# Patient Record
Sex: Male | Born: 1940 | Race: White | Hispanic: No | Marital: Married | Smoking: Former smoker
Health system: Southern US, Community
[De-identification: ages and names within clinical notes are randomized; demographics above are authoritative.]

---

## 1960-06-15 HISTORY — PX: APPENDECTOMY: SHX54

## 2010-04-10 ENCOUNTER — Encounter: Admission: RE | Admit: 2010-04-10 | Discharge: 2010-04-10 | Payer: Self-pay | Admitting: Family Medicine

## 2010-08-12 ENCOUNTER — Ambulatory Visit: Payer: Medicare Other | Attending: Orthopedic Surgery | Admitting: Physical Therapy

## 2010-08-12 DIAGNOSIS — M542 Cervicalgia: Secondary | ICD-10-CM | POA: Insufficient documentation

## 2010-08-12 DIAGNOSIS — IMO0001 Reserved for inherently not codable concepts without codable children: Secondary | ICD-10-CM | POA: Insufficient documentation

## 2010-08-12 DIAGNOSIS — M2569 Stiffness of other specified joint, not elsewhere classified: Secondary | ICD-10-CM | POA: Insufficient documentation

## 2010-08-12 DIAGNOSIS — R293 Abnormal posture: Secondary | ICD-10-CM | POA: Insufficient documentation

## 2010-08-12 DIAGNOSIS — R5381 Other malaise: Secondary | ICD-10-CM | POA: Insufficient documentation

## 2010-08-14 ENCOUNTER — Ambulatory Visit: Payer: Medicare Other | Attending: Orthopedic Surgery | Admitting: Physical Therapy

## 2010-08-14 DIAGNOSIS — R5381 Other malaise: Secondary | ICD-10-CM | POA: Insufficient documentation

## 2010-08-14 DIAGNOSIS — IMO0001 Reserved for inherently not codable concepts without codable children: Secondary | ICD-10-CM | POA: Insufficient documentation

## 2010-08-14 DIAGNOSIS — M542 Cervicalgia: Secondary | ICD-10-CM | POA: Insufficient documentation

## 2010-08-14 DIAGNOSIS — R293 Abnormal posture: Secondary | ICD-10-CM | POA: Insufficient documentation

## 2010-08-14 DIAGNOSIS — M2569 Stiffness of other specified joint, not elsewhere classified: Secondary | ICD-10-CM | POA: Insufficient documentation

## 2010-08-19 ENCOUNTER — Ambulatory Visit: Payer: Medicare Other | Admitting: *Deleted

## 2010-08-21 ENCOUNTER — Ambulatory Visit: Payer: Medicare Other | Admitting: *Deleted

## 2010-08-30 ENCOUNTER — Encounter: Payer: Self-pay | Admitting: *Deleted

## 2010-08-30 DIAGNOSIS — I10 Essential (primary) hypertension: Secondary | ICD-10-CM | POA: Insufficient documentation

## 2010-08-30 DIAGNOSIS — C679 Malignant neoplasm of bladder, unspecified: Secondary | ICD-10-CM | POA: Insufficient documentation

## 2010-08-30 DIAGNOSIS — K219 Gastro-esophageal reflux disease without esophagitis: Secondary | ICD-10-CM | POA: Insufficient documentation

## 2010-08-30 DIAGNOSIS — E785 Hyperlipidemia, unspecified: Secondary | ICD-10-CM | POA: Insufficient documentation

## 2010-08-30 DIAGNOSIS — F32A Depression, unspecified: Secondary | ICD-10-CM | POA: Insufficient documentation

## 2010-08-30 DIAGNOSIS — F329 Major depressive disorder, single episode, unspecified: Secondary | ICD-10-CM

## 2010-10-29 ENCOUNTER — Other Ambulatory Visit: Payer: Self-pay | Admitting: Family Medicine

## 2010-10-29 DIAGNOSIS — R9389 Abnormal findings on diagnostic imaging of other specified body structures: Secondary | ICD-10-CM

## 2010-10-30 ENCOUNTER — Other Ambulatory Visit: Payer: Medicare Other

## 2010-11-07 ENCOUNTER — Other Ambulatory Visit: Payer: Medicare Other

## 2010-11-13 ENCOUNTER — Other Ambulatory Visit: Payer: Medicare Other

## 2010-11-14 ENCOUNTER — Ambulatory Visit
Admission: RE | Admit: 2010-11-14 | Discharge: 2010-11-14 | Disposition: A | Discharge status: Home / Self Care | Payer: Medicare Other | Source: Ambulatory Visit | Attending: Family Medicine | Admitting: Family Medicine

## 2010-11-14 DIAGNOSIS — R9389 Abnormal findings on diagnostic imaging of other specified body structures: Secondary | ICD-10-CM

## 2012-12-31 IMAGING — CT CT CHEST W/O CM
2 of 3 series · 14 of 31 positions shown, 16 images · non-contrast
Comparison: 04/10/2010

CLINICAL DATA: Follow up of abnormal chest CT.  Cough.  Shortness
of breath.  Ex-smoker.  History of bladder cancer in [id number].

CT CHEST WITHOUT CONTRAST
TECHNIQUE: Multidetector CT imaging of the chest was performed
following the standard protocol without IV contrast.

[Series 3: routine chest · axial · 0.75mm/px · z∈[-202,+3]mm · 6 of 59 slices shown, 8 images]
[im 9/59  mediastinal]
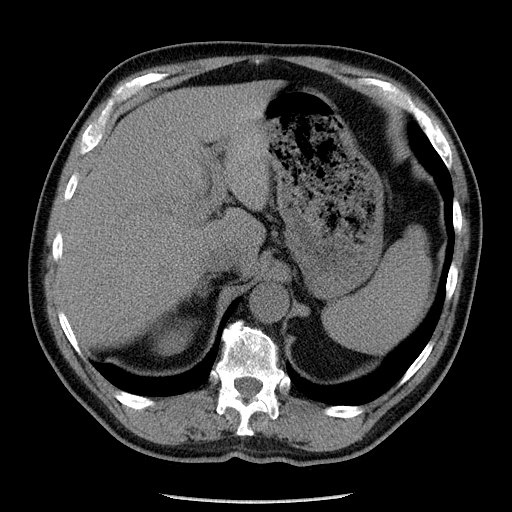
[im 9/59  lung]
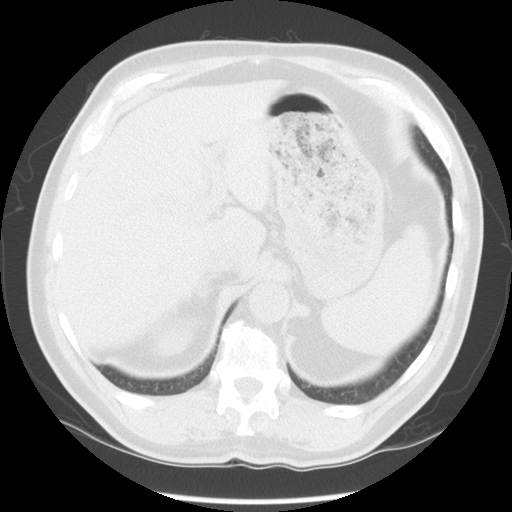
[im 17/59  lung]
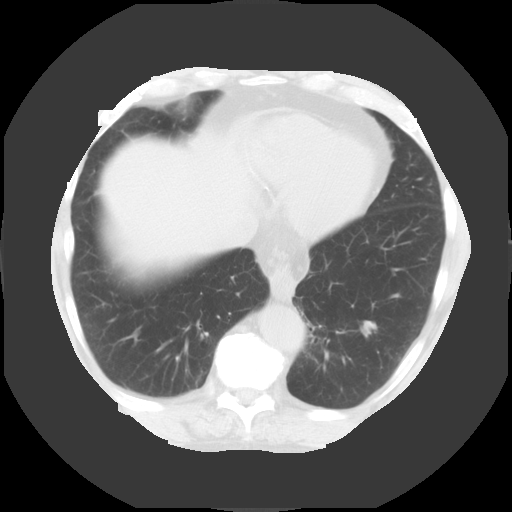
[im 25/59  lung]
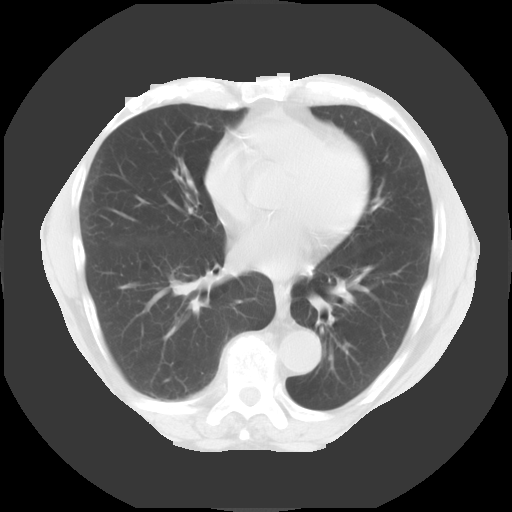
[im 34/59  lung]
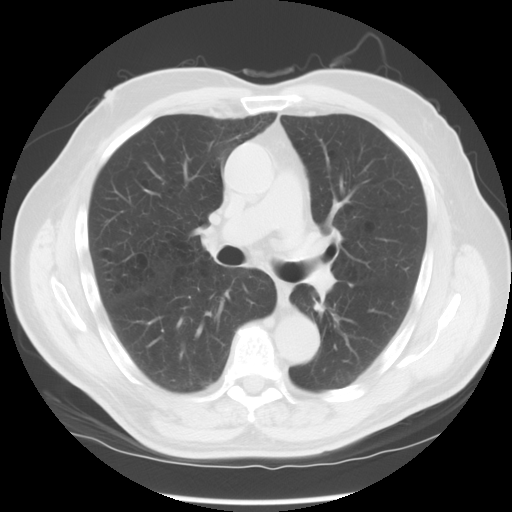
[im 42/59  mediastinal]
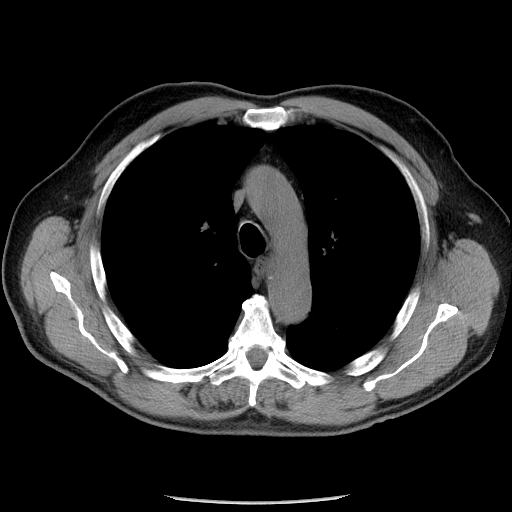
[im 42/59  lung]
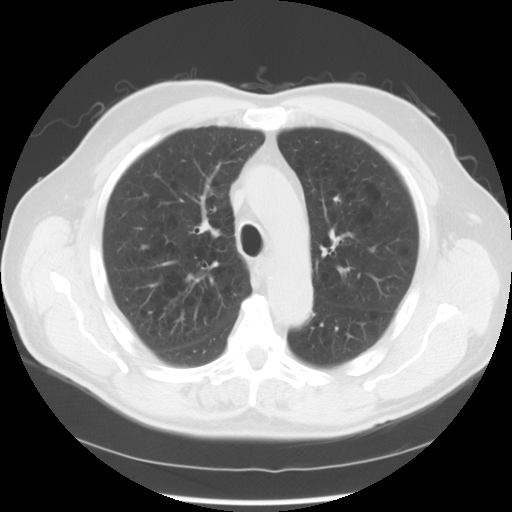
[im 50/59  lung]
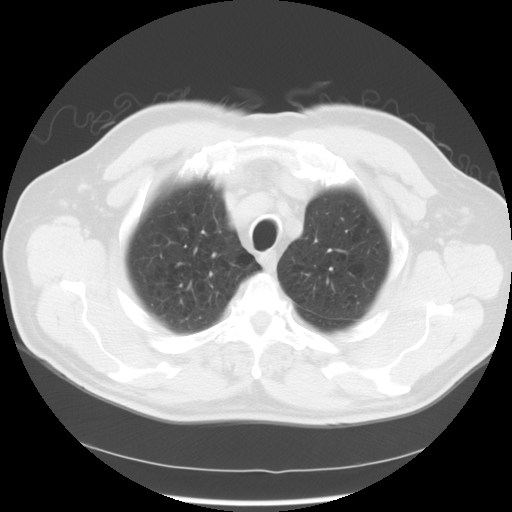

[Series 602: sagittal body · sagittal · 0.75mm/px · 8 of 155 slices shown]
[im 17/155  mediastinal]
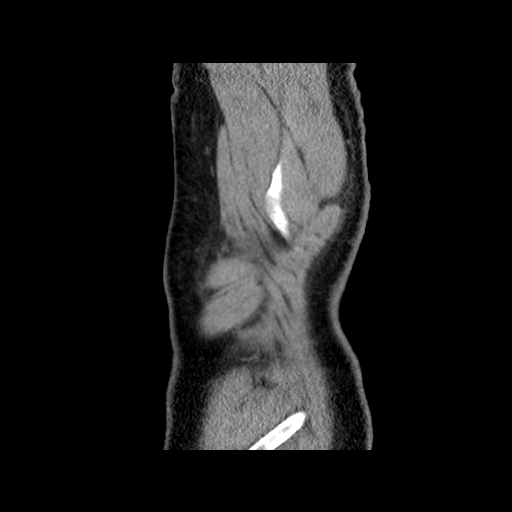
[im 33/155  mediastinal]
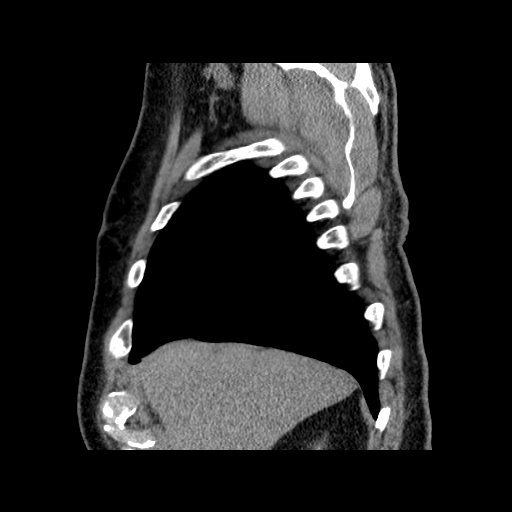
[im 49/155  mediastinal]
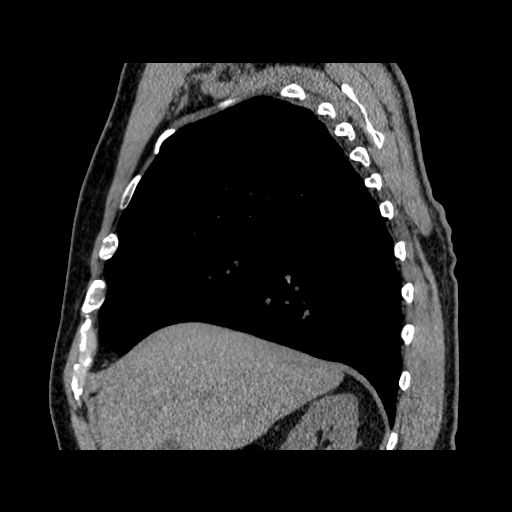
[im 73/155  mediastinal]
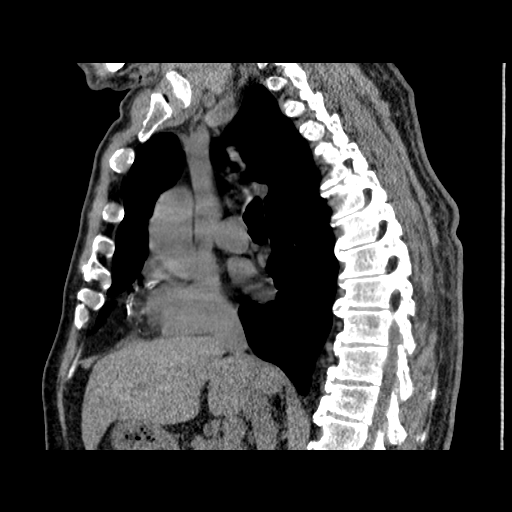
[im 82/155  mediastinal]
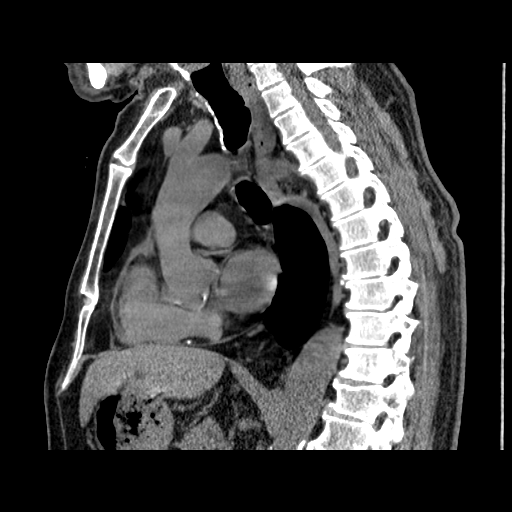
[im 106/155  mediastinal]
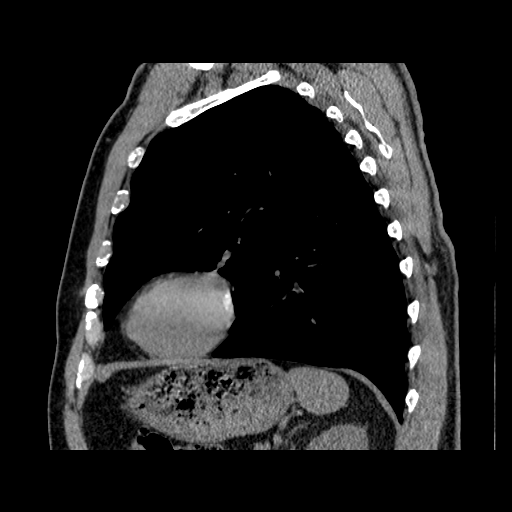
[im 122/155  mediastinal]
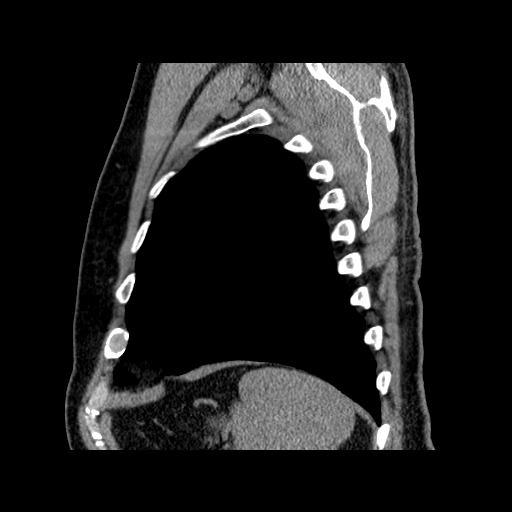
[im 138/155  mediastinal]
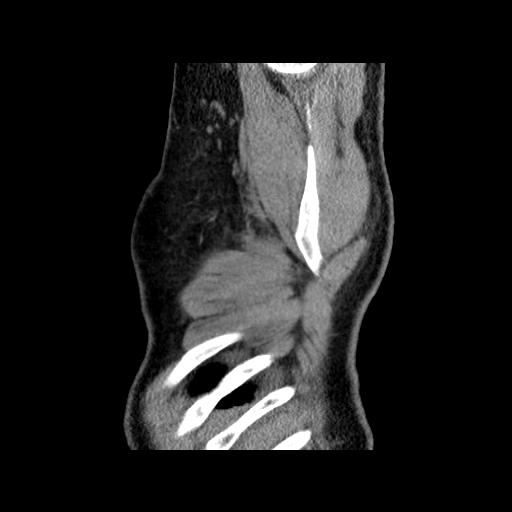

[14 of 31 positions shown; findings below may reference images not displayed]

FINDINGS: Lung windows demonstrate moderate centrilobular
emphysema.  The questioned scarring in the peripheral right middle
lobe is nearly completely resolved.

A 4 mm left lower lobe lung nodule on image 41 is unchanged.  There
may also be an adjacent 3 mm nodule just anteriorly on image 42.
This is also similar.

Soft tissue windows demonstrate tortuous descending thoracic aorta.
Normal heart size without pericardial or pleural effusion.
Multivessel coronary artery atherosclerosis.  No mediastinal or
definite hilar adenopathy, given limitations of unenhanced CT.
Small hiatal hernia.

Limited abdominal imaging demonstrates probable cyst in the left
lobe of the liver, unchanged.  Approximately 8 mm. No acute osseous
abnormality.
IMPRESSION: 1.  Interval near complete resolution of probable atelectasis in
the peripheral right middle lobe.
2.  Centrilobular emphysema with stable left lower lobe lung
nodules measuring up to 4 mm.  Recommend follow-up at approximately
March 2011. This recommendation follows the consensus statement:
Guidelines for Management of Small Pulmonary Nodules Detected on CT
Scans:  A Statement from the [HOSPITAL] as published in
[URL]
3. Multivessel coronary artery atherosclerosis.

## 2013-09-04 ENCOUNTER — Ambulatory Visit (INDEPENDENT_AMBULATORY_CARE_PROVIDER_SITE_OTHER): Payer: Medicare Other | Admitting: Family Medicine

## 2013-09-04 ENCOUNTER — Ambulatory Visit: Payer: Medicare Other

## 2013-09-04 ENCOUNTER — Encounter: Payer: Self-pay | Admitting: Family Medicine

## 2013-09-04 ENCOUNTER — Encounter (INDEPENDENT_AMBULATORY_CARE_PROVIDER_SITE_OTHER): Payer: Self-pay

## 2013-09-04 VITALS — BP 143/77 | HR 68 | Temp 97.0°F | Ht 70.0 in | Wt 224.0 lb

## 2013-09-04 DIAGNOSIS — M25512 Pain in left shoulder: Secondary | ICD-10-CM

## 2013-09-04 DIAGNOSIS — M25519 Pain in unspecified shoulder: Secondary | ICD-10-CM

## 2013-09-04 NOTE — Patient Instructions (Signed)

## 2013-09-04 NOTE — Progress Notes (Signed)
   Subjective:    Patient ID: Justin Shaffer, male    DOB: 05/11/1941, 73 y.o.   MRN: [id number]  HPI This 73 y.o. male presents for evaluation of left shoulder pain and discomfort.  He has been Having this problem for a long time and has not sought tx.  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  He states he gets associated left elbow and wrist and arm pain when he bowls.  He has hx of GERD HTN, and he has a couple moles he wants to have looked at.  He has not had hx of skin cancer.   Review of Systems C/o left shoulder pain No chest pain, SOB, HA, dizziness, vision change, N/V, diarrhea, constipation, dysuria, urinary urgency or frequency, myalgias, arthralgias or rash.     Objective:   Physical Exam Vital signs noted  Well developed well nourished male.  HEENT - Head atraumatic Normocephalic                Eyes - PERRLA, Conjuctiva - clear Sclera- Clear EOMI                Ears - EAC's Wnl TM's Wnl Gross Hearing WNL                Throat - oropharanx wnl Respiratory - Lungs CTA bilateral Cardiac - RRR S1 and S2 without murmur GI - Abdomen soft Nontender and bowel sounds active x 4 Extremities - No edema. Neuro - Grossly intact. MS - Tenderness to palpation left shoulder at acromium.  Decreased ROM left shoulder with          Internal and external rotation. Skin - black nevi on right upper back      Assessment & Plan:  Left shoulder pain - Xray left shoulder.  He wants to wait before getting referral or tx.  [place] - He would like to wait and get another opinion before being referred to dermatology.  Lysbeth Penner FNP

## 2013-09-05 ENCOUNTER — Ambulatory Visit (INDEPENDENT_AMBULATORY_CARE_PROVIDER_SITE_OTHER): Payer: Medicare Other

## 2013-09-05 ENCOUNTER — Other Ambulatory Visit: Payer: Medicare Other

## 2013-09-05 ENCOUNTER — Other Ambulatory Visit: Payer: Self-pay | Admitting: Family Medicine

## 2013-09-05 DIAGNOSIS — M25519 Pain in unspecified shoulder: Secondary | ICD-10-CM

## 2013-09-05 DIAGNOSIS — R52 Pain, unspecified: Secondary | ICD-10-CM

## 2013-10-05 ENCOUNTER — Encounter: Payer: Self-pay | Admitting: Family Medicine

## 2013-10-05 ENCOUNTER — Ambulatory Visit (INDEPENDENT_AMBULATORY_CARE_PROVIDER_SITE_OTHER): Payer: Medicare Other | Admitting: Family Medicine

## 2013-10-05 VITALS — BP 144/81 | HR 83 | Temp 97.6°F | Ht 70.0 in | Wt 223.6 lb

## 2013-10-05 DIAGNOSIS — M25512 Pain in left shoulder: Secondary | ICD-10-CM

## 2013-10-05 DIAGNOSIS — M25519 Pain in unspecified shoulder: Secondary | ICD-10-CM

## 2013-10-05 NOTE — Progress Notes (Signed)
   Subjective:    Patient ID: Justin Shaffer, male    DOB: May 31, 1941, 73 y.o.   MRN: [id number]  HPI  This 73 y.o. male presents for evaluation of follow up on left shoulder discomfort and left shoulder xray.  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  He has been bowling and has difficulty when he bowls.  Review of Systems C/o left shoulder discomfort   No chest pain, SOB, HA, dizziness, vision change, N/V, diarrhea, constipation, dysuria, urinary urgency or frequency, myalgias, arthralgias or rash.  Objective:   Physical Exam  Vital signs noted  Well developed well nourished male  MS - Decreased ROM in left shoulder with abduction, external, and internal rotation.       Assessment & Plan:  Left shoulder pain He will go to [place] and see his ortho doctor.  Lysbeth Penner FNP

## 2015-10-23 IMAGING — CR DG SHOULDER 2+V*L*
3 series · 3 of 3 positions shown · non-contrast
Comparison: None.

CLINICAL DATA: Left shoulder pain.

EXAM:
LEFT SHOULDER - 2+ VIEW

[view not recorded (1 of 3)]
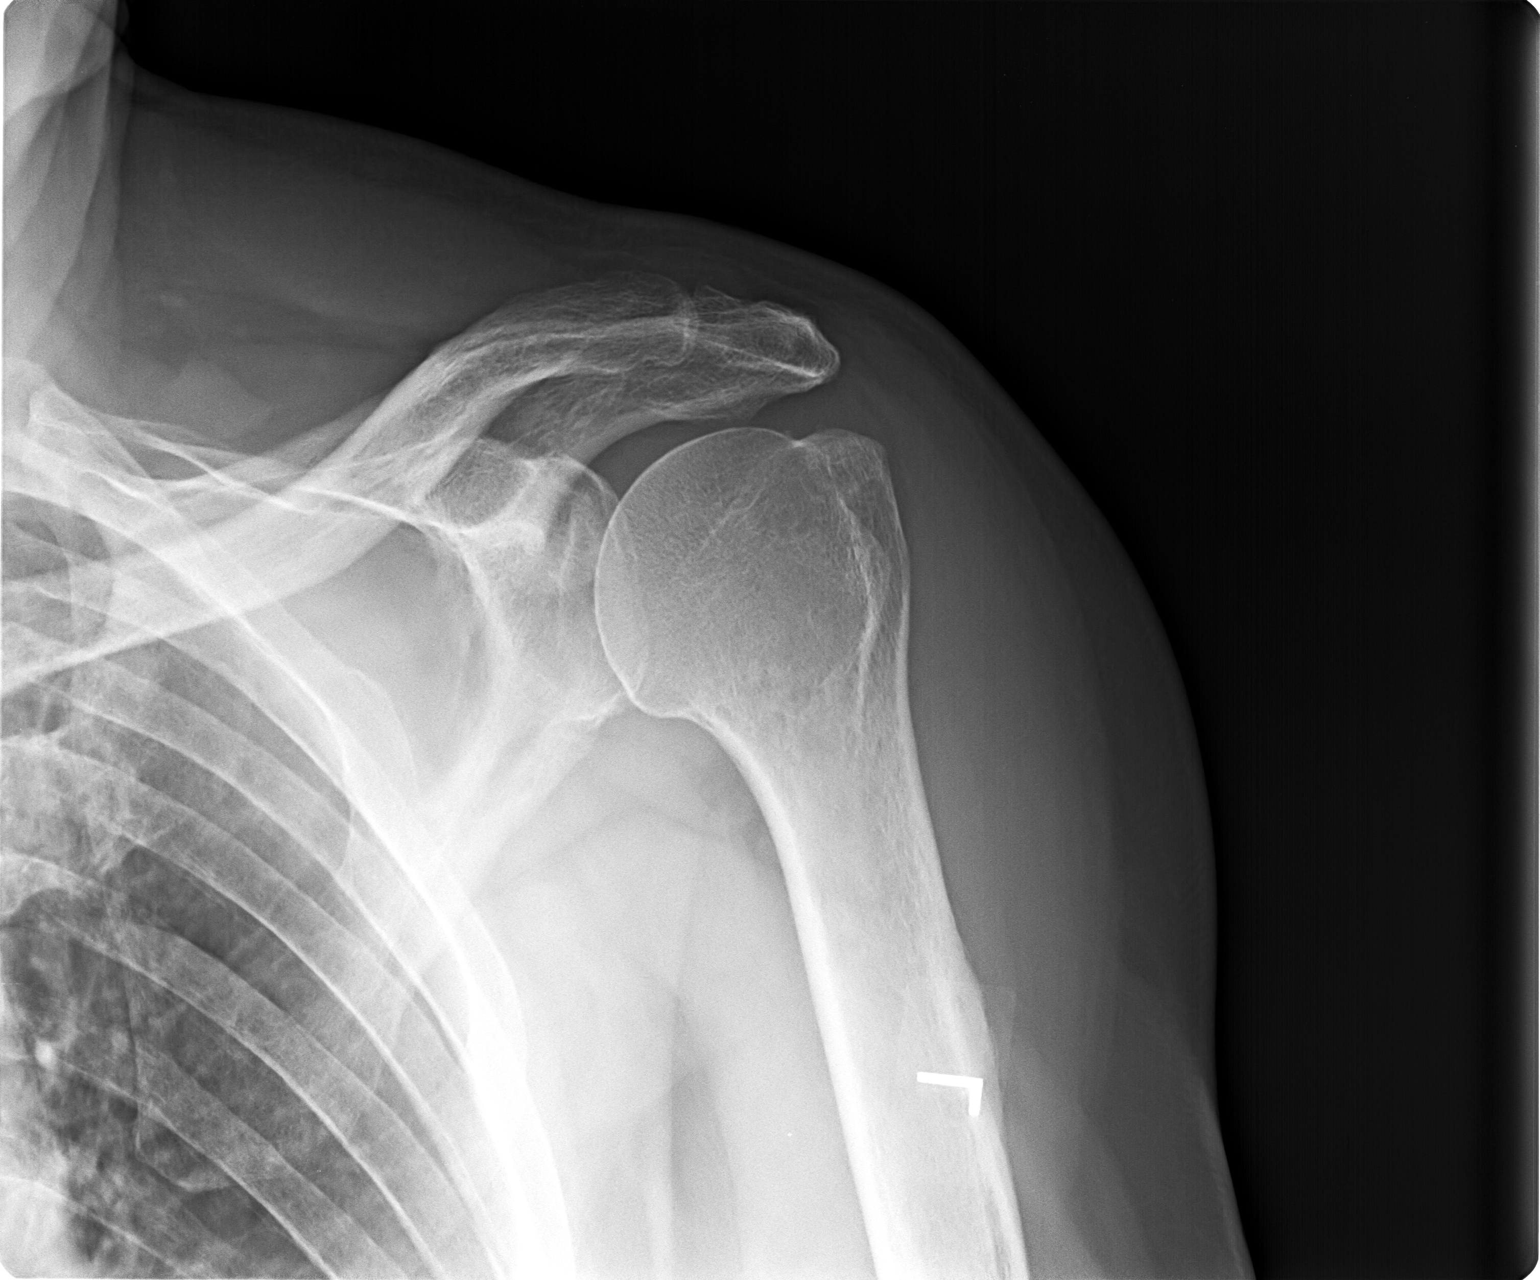

[view not recorded (2 of 3)]
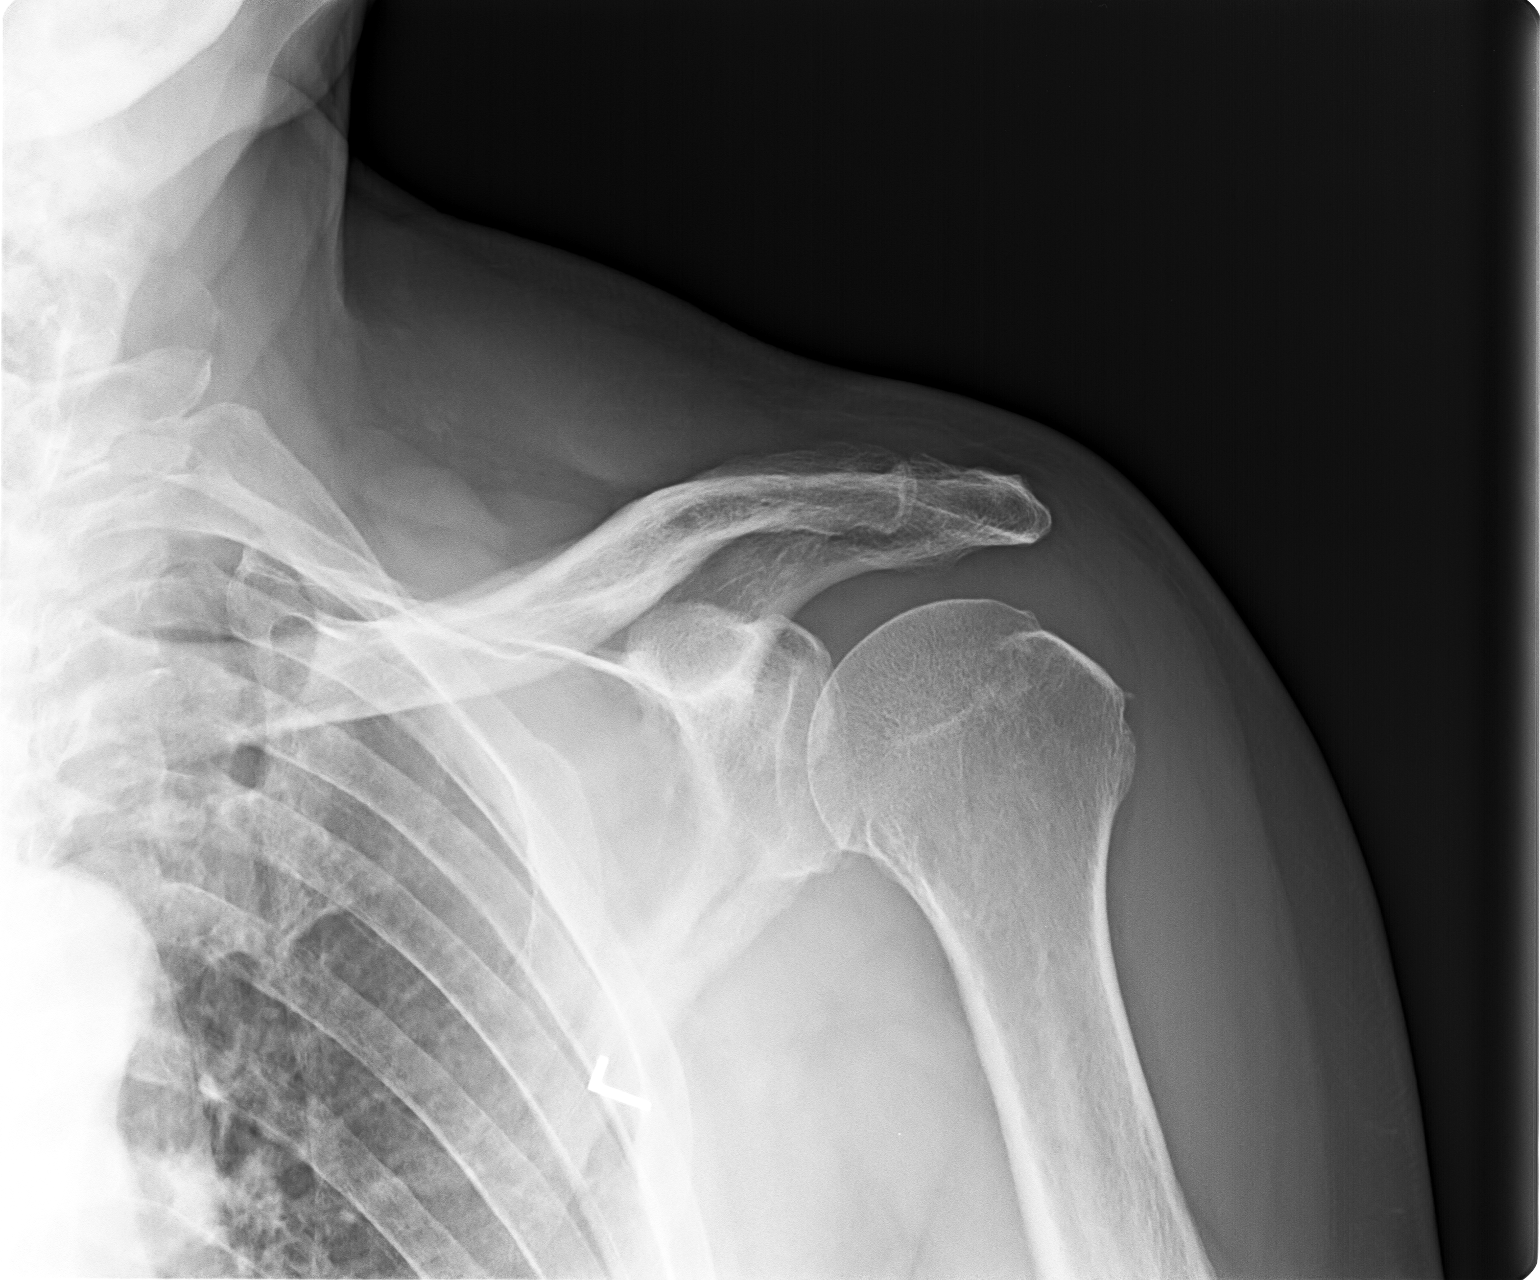

[view not recorded (3 of 3)]
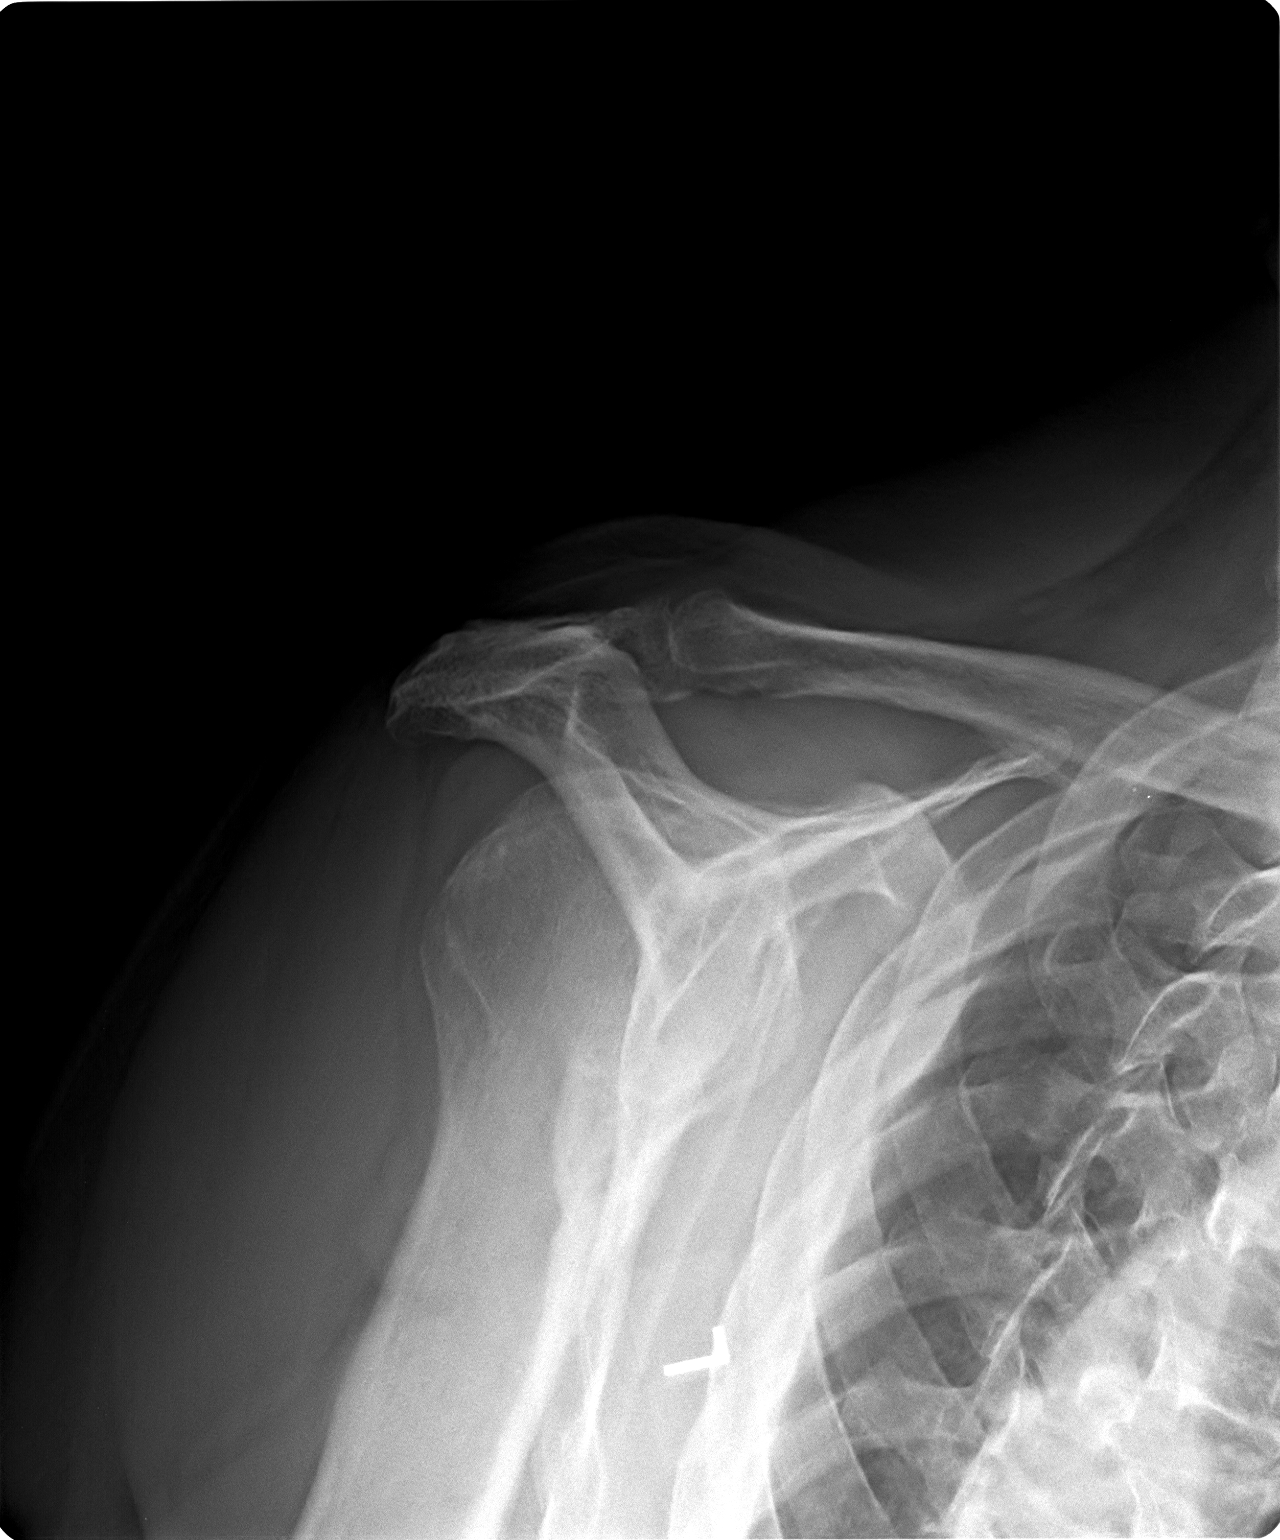

[3 of 3 positions shown; findings below may reference images not displayed]

FINDINGS: There is no evidence of fracture or dislocation. Visualized ribs
appear normal. Mild degenerative joint disease of the left
acromioclavicular joint is noted. Soft tissues are unremarkable.
IMPRESSION: Mild degenerative joint disease of the left acromioclavicular joint.
No acute abnormality seen in the left shoulder.

## 2021-12-13 DEATH — deceased
